# Patient Record
Sex: Female | Born: 1975 | Race: Black or African American | Hispanic: No | Marital: Single | Smoking: Never smoker
Health system: Southern US, Community
[De-identification: ages and names within clinical notes are randomized; demographics above are authoritative.]

---

## 2016-03-19 ENCOUNTER — Encounter: Payer: Self-pay | Admitting: *Deleted

## 2016-03-19 ENCOUNTER — Emergency Department: Payer: Self-pay

## 2016-03-19 DIAGNOSIS — S2002XA Contusion of left breast, initial encounter: Secondary | ICD-10-CM | POA: Insufficient documentation

## 2016-03-19 DIAGNOSIS — Y999 Unspecified external cause status: Secondary | ICD-10-CM | POA: Insufficient documentation

## 2016-03-19 DIAGNOSIS — Y939 Activity, unspecified: Secondary | ICD-10-CM | POA: Insufficient documentation

## 2016-03-19 DIAGNOSIS — Y92410 Unspecified street and highway as the place of occurrence of the external cause: Secondary | ICD-10-CM | POA: Insufficient documentation

## 2016-03-19 LAB — CBC
HCT: 29.2 % — ABNORMAL LOW (ref 35.0–47.0)
Hemoglobin: 9 g/dL — ABNORMAL LOW (ref 12.0–16.0)
MCH: 22.8 pg — ABNORMAL LOW (ref 26.0–34.0)
MCHC: 30.9 g/dL — ABNORMAL LOW (ref 32.0–36.0)
MCV: 73.9 fL — AB (ref 80.0–100.0)
PLATELETS: 523 10*3/uL — AB (ref 150–440)
RBC: 3.95 MIL/uL (ref 3.80–5.20)
RDW: 18.4 % — ABNORMAL HIGH (ref 11.5–14.5)
WBC: 13.1 10*3/uL — AB (ref 3.6–11.0)

## 2016-03-19 LAB — BASIC METABOLIC PANEL
Anion gap: 6 (ref 5–15)
BUN: 12 mg/dL (ref 6–20)
CHLORIDE: 107 mmol/L (ref 101–111)
CO2: 27 mmol/L (ref 22–32)
CREATININE: 0.86 mg/dL (ref 0.44–1.00)
Calcium: 8.8 mg/dL — ABNORMAL LOW (ref 8.9–10.3)
Glucose, Bld: 105 mg/dL — ABNORMAL HIGH (ref 65–99)
Potassium: 3.3 mmol/L — ABNORMAL LOW (ref 3.5–5.1)
SODIUM: 140 mmol/L (ref 135–145)

## 2016-03-19 LAB — TROPONIN I
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
## 2016-03-20 ENCOUNTER — Emergency Department
Admission: EM | Admit: 2016-03-20 | Discharge: 2016-03-20 | Disposition: A | Discharge status: Home / Self Care | Payer: Self-pay | Attending: Emergency Medicine | Admitting: Emergency Medicine

## 2016-03-20 ENCOUNTER — Encounter: Payer: Self-pay | Admitting: Emergency Medicine

## 2016-03-20 DIAGNOSIS — S2002XA Contusion of left breast, initial encounter: Secondary | ICD-10-CM

## 2016-03-20 DIAGNOSIS — N6489 Other specified disorders of breast: Secondary | ICD-10-CM

## 2016-03-20 MED ORDER — KETOROLAC TROMETHAMINE 60 MG/2ML IM SOLN
60.0000 mg | Freq: Once | INTRAMUSCULAR | Status: AC
  Administered 2016-03-20: 60 mg via INTRAMUSCULAR
  Filled 2016-03-20: qty 2

## 2016-03-20 MED ORDER — ETODOLAC 200 MG PO CAPS: 200.0000 mg | ORAL_CAPSULE | Freq: Three times a day (TID) | ORAL | 0 refills | Status: DC

## 2016-03-20 MED ORDER — TRAMADOL HCL 50 MG PO TABS
50.0000 mg | ORAL_TABLET | Freq: Once | ORAL | Status: AC
  Administered 2016-03-20: 50 mg via ORAL
  Filled 2016-03-20: qty 1
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
## 2016-03-20 NOTE — Discharge Instructions (Signed)
Please ice your left breast and where supportive bras. Please follow up to have her blood counts reviewed. They did decrease from the last ER visit. It is likely due to the hematoma but as she has no secondary symptoms of symptomatic anemia we will have reevaluated. Please return with any lightheadedness, dizziness or passing out episodes. Please return with worsened pain nausea or vomiting.
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
## 2016-03-20 NOTE — ED Notes (Signed)
ED Provider at bedside. 

## 2017-03-06 ENCOUNTER — Emergency Department
Admission: EM | Admit: 2017-03-06 | Discharge: 2017-03-06 | Disposition: A | Discharge status: Home / Self Care | Payer: Medicaid Other | Attending: Emergency Medicine | Admitting: Emergency Medicine

## 2017-03-06 ENCOUNTER — Other Ambulatory Visit: Payer: Self-pay

## 2017-03-06 ENCOUNTER — Encounter: Payer: Self-pay | Admitting: Emergency Medicine

## 2017-03-06 DIAGNOSIS — K625 Hemorrhage of anus and rectum: Secondary | ICD-10-CM | POA: Diagnosis not present

## 2017-03-06 LAB — CBC
HEMATOCRIT: 31.8 % — AB (ref 35.0–47.0)
HEMOGLOBIN: 9.9 g/dL — AB (ref 12.0–16.0)
MCH: 22.6 pg — AB (ref 26.0–34.0)
MCHC: 31.1 g/dL — AB (ref 32.0–36.0)
MCV: 72.5 fL — AB (ref 80.0–100.0)
Platelets: 529 10*3/uL — ABNORMAL HIGH (ref 150–440)
RBC: 4.38 MIL/uL (ref 3.80–5.20)
RDW: 19.4 % — ABNORMAL HIGH (ref 11.5–14.5)
WBC: 9.9 10*3/uL (ref 3.6–11.0)

## 2017-03-06 LAB — COMPREHENSIVE METABOLIC PANEL
ALBUMIN: 3.3 g/dL — AB (ref 3.5–5.0)
ALK PHOS: 78 U/L (ref 38–126)
ALT: 20 U/L (ref 14–54)
ANION GAP: 6 (ref 5–15)
AST: 24 U/L (ref 15–41)
BUN: 7 mg/dL (ref 6–20)
CO2: 27 mmol/L (ref 22–32)
Calcium: 8.5 mg/dL — ABNORMAL LOW (ref 8.9–10.3)
Chloride: 103 mmol/L (ref 101–111)
Creatinine, Ser: 0.79 mg/dL (ref 0.44–1.00)
GFR calc Af Amer: >60 mL/min (ref >60)
GFR calc non Af Amer: >60 mL/min (ref >60)
GLUCOSE: 171 mg/dL — AB (ref 65–99)
POTASSIUM: 3 mmol/L — AB (ref 3.5–5.1)
SODIUM: 136 mmol/L (ref 135–145)
Total Bilirubin: 0.6 mg/dL (ref 0.3–1.2)
Total Protein: 7.3 g/dL (ref 6.5–8.1)

## 2017-03-06 MED ORDER — DOCUSATE SODIUM 100 MG PO CAPS: 100.0000 mg | ORAL_CAPSULE | Freq: Two times a day (BID) | ORAL | 0 refills | Status: AC

## 2017-03-06 NOTE — ED Notes (Signed)
Pt ambulatory to toilet by self. Given urine specimen cup in case of need for urine.

## 2017-03-06 NOTE — ED Notes (Addendum)
Pt states rectal bleeding x 1 week. States didn't think anything of it until she noticed blood in her bed. Finished period on 12/18 so doesn't think bleeding is vaginal. C/o abdominal pain L and R upper quadrants intermittently. Still has all belly organs. Had hemorroids 9 years ago. BM today. States only notices blood when she wipes.

## 2017-03-06 NOTE — Discharge Instructions (Signed)
To the emergency department for new, worsening, or persistent bleeding from the rectum or vaginal area, lightheadedness or weakness, worsening abdominal pain, or any other new or worsening symptoms that concern you.  You can make an appointment to follow-up with the general surgeon for further evaluation for what are likely hemorrhoids causing the bleeding.  You should also make an appointment with your OB/GYN if you have further vaginal bleeding.

## 2017-03-06 NOTE — ED Provider Notes (Signed)
[facility] Emergency Department Provider Note ____________________________________________   First MD Initiated Contact with Patient 03/06/17 1225     (approximate)  I have reviewed the triage vital signs and the nursing notes.   HISTORY  Chief Complaint Rectal Bleeding    HPI Kaitlin Beasley is a 41 y.o. female with past medical history as noted below who presents with rectal and/or vaginal bleeding for the last week, initially only noticing blood when she wipes after a bowel movement, but then today she noticed a trickle of blood while she was laying in the bed.  Patient reports bright red blood.  There is no associated rectal or vaginal.  Patient does say that since she got to the emergency department she has had some mid/upper abdominal crampy discomfort.  No urinary symptoms, no vaginal discharge, and no prior history of this pain or bleeding.  Patient states that she does strain with bowel movements, but has not noticed any specific change in her bowel movements recently.  She denies nausea or vomiting.  She denies fever.  History reviewed. No pertinent past medical history.  There are no active problems to display for this patient.   History reviewed. No pertinent surgical history.  Prior to Admission medications   Medication Sig Start Date End Date Taking? Authorizing Provider  docusate sodium (COLACE) 100 MG capsule Take 1 capsule (100 mg total) by mouth 2 (two) times daily for 15 days. 03/06/17 03/21/17  Dionne BucySiadecki, Shelsy Seng, MD  etodolac (LODINE) 200 MG capsule Take 1 capsule (200 mg total) by mouth every 8 (eight) hours. 03/20/16   Rebecka ApleyWebster, Allison P, MD    Allergies Patient has no known allergies.  No family history on file.  Social History Social History   Tobacco Use  . Smoking status: Never Smoker  . Smokeless tobacco: Never Used  Substance Use Topics  . Alcohol use: No  . Drug use: Not on file    Review of  Systems  Constitutional: No fever.  Eyes: No redness. ENT: No sore throat. Cardiovascular: Denies chest pain. Respiratory: Denies shortness of breath. Gastrointestinal: No nausea, no vomiting.  No diarrhea.  Genitourinary: Negative for dysuria.  Musculoskeletal: Negative for back pain. Skin: Negative for rash. Neurological: Negative for headache.   ____________________________________________   PHYSICAL EXAM:  VITAL SIGNS: ED Triage Vitals  Enc Vitals Group     BP 03/06/17 1056 (!) 157/88     Pulse Rate 03/06/17 1056 80     Resp 03/06/17 1228 (!) 23     Temp 03/06/17 1056 98.6 F (37 C)     Temp Source 03/06/17 1056 Oral     SpO2 03/06/17 1056 100 %     Weight 03/06/17 1057 (!) 340 lb (154.2 kg)     Height 03/06/17 1057 5\' 8"  (1.727 m)     Head Circumference --      Peak Flow --      Pain Score 03/06/17 1112 0     Pain Loc --      Pain Edu? --      Excl. in GC? --     Constitutional: Alert and oriented. Well appearing and in no acute distress. Eyes: Conjunctivae are normal.  No pallor. Head: Atraumatic. Nose: No congestion/rhinnorhea. Mouth/Throat: Mucous membranes are moist.   Neck: Normal range of motion.  Cardiovascular:   Good peripheral circulation. Respiratory: Normal respiratory effort.  No retractions.  Gastrointestinal: Soft and nontender. No distention.  Small external hemorrhoid visualized, but no active bleeding  or blood found on DRE.  Stool is brown.  Genitourinary: No flank tenderness.  Normal external genitalia.  No CMT or adnexal tenderness.  No blood in the vaginal vault. Musculoskeletal: Extremities warm and well perfused.  Neurologic:  Normal speech and language. No gross focal neurologic deficits are appreciated.  Skin:  Skin is warm and dry. No rash noted. Psychiatric: Mood and affect are normal. Speech and behavior are normal.  ____________________________________________   LABS (all labs ordered are listed, but only abnormal results are  displayed)  Labs Reviewed  COMPREHENSIVE METABOLIC PANEL - Abnormal; Notable for the following components:      Result Value   Potassium 3.0 (*)    Glucose, Bld 171 (*)    Calcium 8.5 (*)    Albumin 3.3 (*)    All other components within normal limits  CBC - Abnormal; Notable for the following components:   Hemoglobin 9.9 (*)    HCT 31.8 (*)    MCV 72.5 (*)    MCH 22.6 (*)    MCHC 31.1 (*)    RDW 19.4 (*)    Platelets 529 (*)    All other components within normal limits   ____________________________________________  EKG   ____________________________________________  RADIOLOGY    ____________________________________________   PROCEDURES  Procedure(s) performed: No    Critical Care performed: No ____________________________________________   INITIAL IMPRESSION / ASSESSMENT AND PLAN / ED COURSE  Pertinent labs & imaging results that were available during my care of the patient were reviewed by me and considered in my medical decision making (see chart for details).  41 year old female with past medical history of hypertension but currently not on any medications presents with rectal and/or vaginal bleeding for the last week, initially occurring just with bowel movements but now with an episode in between bowel movements.  She also just started having some abdominal crampy pain since she arrived to the emergency department.  Past medical records reviewed in Epic and are noncontributory.  On exam, patient is well-appearing, vital signs are normal, and the abdomen is nontender.  Vaginal exam is unremarkable, and the rectal exam reveals small external hemorrhoid which is nonthrombosed, but no blood in the vault or active bleeding.  Patient states she had a bowel movement in the emergency department and the pain has completely resolved.  There was no blood during her bowel movement here.  I suspect most likely mild hemorrhoidal bleeding.  Lower suspicion for vaginal  source, in which case most likely etiology would be fibroids.  Patient's hemoglobin is actually improved from our last record 11 months ago, and she has no symptoms of anemia or acute blood loss.  Borderline hyponatremia but patient is not symptomatic from this.    Patient feels much better and would like to go home.  Return precautions given.  I will start the patient on stool softener, and I have provided her a referral to general surgeon for further evaluation and of her hemorrhoids if she has any recurrent bleeding.  Patient expresses understanding of the discharge instructions and return precautions.      ____________________________________________   FINAL CLINICAL IMPRESSION(S) / ED DIAGNOSES  Final diagnoses:  Rectal bleeding      NEW MEDICATIONS STARTED DURING THIS VISIT:  This SmartLink is deprecated. Use AVSMEDLIST instead to display the medication list for a patient.   Note:  This document was prepared using Dragon voice recognition software and may include unintentional dictation errors.    Dionne BucySiadecki, Marquerite Forsman, MD 03/06/17 [id number]

## 2017-03-06 NOTE — ED Triage Notes (Signed)
Pt reports that she has had a light red blood for a week, when she has gone to the bathroom and wiped. She states that this am she didn't go to the bathroom and felt something wet and she wiped her self and had light red on the tissue paper.

## 2017-03-06 NOTE — ED Notes (Signed)
Pelvic and rectal exams performed by MD. Witnessed by this RN. No swabs sent. No bleeding noted. MD noted external hemorroid. Pt stated that she has no pain after using the restroom.

## 2017-09-17 DIAGNOSIS — R51 Headache: Secondary | ICD-10-CM | POA: Insufficient documentation

## 2017-09-17 DIAGNOSIS — Z79899 Other long term (current) drug therapy: Secondary | ICD-10-CM | POA: Insufficient documentation

## 2017-09-17 NOTE — ED Triage Notes (Signed)
Patient ambulatory to triage with steady gait, without difficulty or distress noted; pt reports right sided HA x 2 wk accomp by nausea; took ibuprofen at 12 with some relief; denies hx of same

## 2017-09-18 ENCOUNTER — Encounter: Payer: Self-pay | Admitting: Emergency Medicine

## 2017-09-18 ENCOUNTER — Emergency Department: Payer: Self-pay

## 2017-09-18 ENCOUNTER — Other Ambulatory Visit: Payer: Self-pay

## 2017-09-18 ENCOUNTER — Emergency Department
Admission: EM | Admit: 2017-09-18 | Discharge: 2017-09-18 | Disposition: A | Discharge status: Home / Self Care | Payer: Self-pay | Attending: Emergency Medicine | Admitting: Emergency Medicine

## 2017-09-18 DIAGNOSIS — R51 Headache: Secondary | ICD-10-CM

## 2017-09-18 DIAGNOSIS — R519 Headache, unspecified: Secondary | ICD-10-CM

## 2017-09-18 LAB — URINALYSIS, COMPLETE (UACMP) WITH MICROSCOPIC
Bacteria, UA: NONE SEEN
Bilirubin Urine: NEGATIVE
GLUCOSE, UA: NEGATIVE mg/dL
Hgb urine dipstick: NEGATIVE
Ketones, ur: NEGATIVE mg/dL
Nitrite: NEGATIVE
PH: 6 (ref 5.0–8.0)
Protein, ur: NEGATIVE mg/dL
SPECIFIC GRAVITY, URINE: 1.019 (ref 1.005–1.030)

## 2017-09-18 LAB — POCT PREGNANCY, URINE: Preg Test, Ur: NEGATIVE

## 2017-09-18 MED ORDER — BUTALBITAL-APAP-CAFFEINE 50-325-40 MG PO TABS: 1.0000 | ORAL_TABLET | Freq: Four times a day (QID) | ORAL | 0 refills | Status: DC | PRN

## 2017-09-18 MED ORDER — BUTALBITAL-APAP-CAFFEINE 50-325-40 MG PO TABS
1.0000 | ORAL_TABLET | Freq: Once | ORAL | Status: AC
  Administered 2017-09-18: 1 via ORAL
  Filled 2017-09-18: qty 1

## 2017-09-18 NOTE — ED Provider Notes (Signed)
[facility] Emergency Department Provider Note ____   First MD Initiated Contact with Patient 09/18/17 0118     (approximate)  I have reviewed the triage vital signs and the nursing notes.   HISTORY  Chief Complaint No chief complaint on file.    HPI Kaitlin Beasley is a 42 y.o. female Modena Jansky to the emergency department with a 2-week history of generalized headache accompanied by nausea.  Patient states that she is taking ibuprofen without any relief.  Patient states current pain score is 5 out of 10.  Patient denies any weakness numbness gait instability or visual changes.  Patient does admit to daytime somnolence.  Patient states that she snores very loudly and also notes that she has periods of times where she stops breathing while sleeping.   Past medical history None There are no active problems to display for this patient.   Past surgical history None  Prior to Admission medications   Medication Sig Start Date End Date Taking? Authorizing Provider  etodolac (LODINE) 200 MG capsule Take 1 capsule (200 mg total) by mouth every 8 (eight) hours. 03/20/16   Rebecka Apley, MD    Allergies No known drug allergies she is No family history on file.  Social History Social History   Tobacco Use  . Smoking status: Never Smoker  . Smokeless tobacco: Never Used  Substance Use Topics  . Alcohol use: No  . Drug use: Not on file    Review of Systems Constitutional: No fever/chills Eyes: No visual changes. ENT: No sore throat. Cardiovascular: Denies chest pain. Respiratory: Denies shortness of breath. Gastrointestinal: No abdominal pain.  No nausea, no vomiting.  No diarrhea.  No constipation. Genitourinary: Negative for dysuria. Musculoskeletal: Negative for neck pain.  Negative for back pain. Integumentary: Negative for rash. Neurological: Negative for headaches, focal weakness or  numbness.  ____________________________________________   PHYSICAL EXAM:  VITAL SIGNS: ED Triage Vitals  Enc Vitals Group     BP 09/17/17 2354 (!) 160/92     Pulse Rate 09/17/17 2354 89     Resp 09/17/17 2354 18     Temp 09/17/17 2354 98.3 F (36.8 C)     Temp Source 09/17/17 2354 Oral     SpO2 09/17/17 2354 98 %     Weight 09/17/17 2354 (!) 145.2 kg (320 lb)     Height 09/17/17 2354 1.702 m (5\' 7" )     Head Circumference --      Peak Flow --      Pain Score 09/18/17 0047 5     Pain Loc --      Pain Edu? --      Excl. in GC? --     Constitutional: Alert and oriented. Well appearing and in no acute distress. Eyes: Conjunctivae are normal. PERRL. EOMI. Head: Atraumatic. Mouth/Throat: Mucous membranes are moist. Oropharynx non-erythematous. Neck: No stridor.   Cardiovascular: Normal rate, regular rhythm. Good peripheral circulation. Grossly normal heart sounds. Respiratory: Normal respiratory effort.  No retractions. Lungs CTAB. Gastrointestinal: Soft and nontender. No distention.  Musculoskeletal: No lower extremity tenderness nor edema. No gross deformities of extremities. Neurologic:  Normal speech and language. No gross focal neurologic deficits are appreciated.  Skin:  Skin is warm, dry and intact. No rash noted. Psychiatric: Mood and affect are normal. Speech and behavior are normal.  ____________________________________________   LABS (all labs ordered are listed, but only abnormal results are displayed)  Labs Reviewed  URINALYSIS, COMPLETE (UACMP) WITH MICROSCOPIC -  Abnormal; Notable for the following components:      Result Value   Color, Urine YELLOW (*)    APPearance CLEAR (*)    Leukocytes, UA TRACE (*)    All other components within normal limits  POCT PREGNANCY, URINE    RADIOLOGY I, [place] Ellese Julius, personally viewed and evaluated these images (plain radiographs) as part of my medical decision making, as well as reviewing the written report by the  radiologist.  ED MD interpretation: No acute intracranial findings  Official radiology report(s): Ct Head Wo Contrast  Result Date: 09/18/2017 CLINICAL DATA:  Acute headache with normal neuro exam. History of chronic headaches. EXAM: CT HEAD WITHOUT CONTRAST TECHNIQUE: Contiguous axial images were obtained from the base of the skull through the vertex without intravenous contrast. COMPARISON:  None. FINDINGS: Brain: No evidence of acute infarction, hemorrhage, hydrocephalus, extra-axial collection or mass lesion/mass effect. Partially empty sella Vascular: No hyperdense vessel or unexpected calcification. 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 No acute finding or specific explanation for headache. 2. Partially empty sella, commonly seen and incidental but sometimes attributed to chronic elevated intracranial pressure. Pseudotumor is a consideration given the history and patient factors. 3. Small remote appearing subcortical insult in the right frontal region, nonspecific. Electronically Signed   By: Marnee SpringJonathon  Watts M.D.   On: 09/18/2017 00:38    __________________________  Procedures   ____________________________________________   INITIAL IMPRESSION / ASSESSMENT AND PLAN / ED COURSE  As part of my medical decision making, I reviewed the following data within the electronic MEDICAL RECORD NUMBER   42 year old female presented with above-stated history and physical exam secondary to headache.  Patient given Fioricet with improvement of pain.  History and physical exam concerning for possible sleep apnea and as such patient will be referred to Dr. Thedore MinsSingh for further outpatient evaluation and management.  Also concern for possibility of hypertension and as such patient will be referred for this as well. ____________________________________________  FINAL CLINICAL IMPRESSION(S) / ED DIAGNOSES  Final diagnoses:  Acute nonintractable  headache, unspecified headache type     MEDICATIONS GIVEN DURING THIS VISIT:  Medications  butalbital-acetaminophen-caffeine (FIORICET, ESGIC) 50-325-40 MG per tablet 1 tablet (has no administration in time range)     ED Discharge Orders    None       Note:  This document was prepared using Dragon voice recognition software and may include unintentional dictation errors.    Darci CurrentBrown, [place] N, MD 09/18/17 [phone]

## 2017-09-24 ENCOUNTER — Encounter: Payer: Self-pay | Admitting: Emergency Medicine

## 2017-09-24 ENCOUNTER — Emergency Department
Admission: EM | Admit: 2017-09-24 | Discharge: 2017-09-24 | Disposition: A | Discharge status: Home / Self Care | Payer: Self-pay | Attending: Emergency Medicine | Admitting: Emergency Medicine

## 2017-09-24 ENCOUNTER — Other Ambulatory Visit: Payer: Self-pay

## 2017-09-24 DIAGNOSIS — I10 Essential (primary) hypertension: Secondary | ICD-10-CM | POA: Insufficient documentation

## 2017-09-24 DIAGNOSIS — Z79899 Other long term (current) drug therapy: Secondary | ICD-10-CM | POA: Insufficient documentation

## 2017-09-24 LAB — BASIC METABOLIC PANEL
ANION GAP: 6 (ref 5–15)
BUN: 11 mg/dL (ref 6–20)
CALCIUM: 8.9 mg/dL (ref 8.9–10.3)
CO2: 27 mmol/L (ref 22–32)
Chloride: 105 mmol/L (ref 98–111)
Creatinine, Ser: 1 mg/dL (ref 0.44–1.00)
Glucose, Bld: 133 mg/dL — ABNORMAL HIGH (ref 70–99)
Potassium: 3.4 mmol/L — ABNORMAL LOW (ref 3.5–5.1)
SODIUM: 138 mmol/L (ref 135–145)

## 2017-09-24 LAB — TROPONIN I

## 2017-09-24 LAB — CBC
HCT: 29.4 % — ABNORMAL LOW (ref 35.0–47.0)
HEMOGLOBIN: 9.1 g/dL — AB (ref 12.0–16.0)
MCH: 21.1 pg — ABNORMAL LOW (ref 26.0–34.0)
MCHC: 30.9 g/dL — ABNORMAL LOW (ref 32.0–36.0)
MCV: 68.4 fL — ABNORMAL LOW (ref 80.0–100.0)
Platelets: 536 10*3/uL — ABNORMAL HIGH (ref 150–440)
RBC: 4.3 MIL/uL (ref 3.80–5.20)
RDW: 19.8 % — ABNORMAL HIGH (ref 11.5–14.5)
WBC: 13.2 10*3/uL — AB (ref 3.6–11.0)

## 2017-09-24 LAB — POCT PREGNANCY, URINE: PREG TEST UR: NEGATIVE

## 2017-09-24 MED ORDER — AMLODIPINE BESYLATE 5 MG PO TABS
5.0000 mg | ORAL_TABLET | Freq: Once | ORAL | Status: AC
  Administered 2017-09-24: 5 mg via ORAL
  Filled 2017-09-24: qty 1

## 2017-09-24 MED ORDER — AMLODIPINE BESYLATE 5 MG PO TABS: 5.0000 mg | ORAL_TABLET | Freq: Every day | ORAL | 0 refills | Status: AC

## 2017-09-24 MED ORDER — OXYCODONE-ACETAMINOPHEN 5-325 MG PO TABS
1.0000 | ORAL_TABLET | Freq: Once | ORAL | Status: AC
  Administered 2017-09-24: 1 via ORAL
  Filled 2017-09-24: qty 1

## 2017-09-24 NOTE — ED Triage Notes (Signed)
Patient to ER for c/o HTN. States she was seen here once before for same complaint. Patient states prior to that, she did not have history of any HTN. Also reports headache. Patient reports BP of 154/111 at home. Denies any chest pain.

## 2017-09-24 NOTE — ED Provider Notes (Signed)
[facility] Emergency Department Provider Note   Time seen 5:15 AM  I have reviewed the triage vital signs and the nursing notes.   HISTORY  Chief Complaint Hypertension    HPI Kaitlin Beasley is a 42 y.o. female presents to the emergency department with hypertension with blood pressure 154/111 at home.  Also patient admits to right temporal headache.  Patient denies any weakness numbness gait instability or visual changes.   Past medical history None There are no active problems to display for this patient.   History reviewed. No pertinent surgical history.  Prior to Admission medications   Medication Sig Start Date End Date Taking? Authorizing Provider  butalbital-acetaminophen-caffeine (FIORICET, ESGIC) [phone] [id number] MG tablet Take 1 tablet by mouth every 6 (six) hours as needed for headache. 09/18/17   Darci CurrentBrown, Aldan N, MD  etodolac (LODINE) 200 MG capsule Take 1 capsule (200 mg total) by mouth every 8 (eight) hours. 03/20/16   Rebecka ApleyWebster, Allison P, MD    Allergies No known drug allergies No family history on file.  Social History Social History   Tobacco Use  . Smoking status: Never Smoker  . Smokeless tobacco: Never Used  Substance Use Topics  . Alcohol use: No  . Drug use: Not on file    Review of Systems Constitutional: No fever/chills Eyes: No visual changes. ENT: No sore throat. Cardiovascular: Denies chest pain. Respiratory: Denies shortness of breath. Gastrointestinal: No abdominal pain.  No nausea, no vomiting.  No diarrhea.  No constipation. Genitourinary: Negative for dysuria. Musculoskeletal: Negative for neck pain.  Negative for back pain. Integumentary: Negative for rash. Neurological: Positive for headaches, negative for focal weakness or numbness.   ____________________________________________   PHYSICAL EXAM:  VITAL SIGNS: ED Triage Vitals  Enc Vitals Group     BP 09/24/17 0139 131/77     Pulse Rate 09/24/17  0139 98     Resp 09/24/17 0139 20     Temp 09/24/17 0139 98.6 F (37 C)     Temp Source 09/24/17 0139 Oral     SpO2 09/24/17 0139 96 %     Weight 09/24/17 0140 (!) 145.2 kg (320 lb)     Height 09/24/17 0140 1.702 m (5\' 7" )     Head Circumference --      Peak Flow --      Pain Score 09/24/17 0139 0     Pain Loc --      Pain Edu? --      Excl. in GC? --     Constitutional: Alert and oriented. Well appearing and in no acute distress. Eyes: Conjunctivae are normal. PERRL. EOMI. Head: Atraumatic. Ears:  Healthy appearing ear canals and TMs bilaterally Nose: No congestion/rhinnorhea. Mouth/Throat: Mucous membranes are moist.  Oropharynx non-erythematous. Neck: No stridor.  Cardiovascular: Normal rate, regular rhythm. Good peripheral circulation. Grossly normal heart sounds. Respiratory: Normal respiratory effort.  No retractions. Lungs CTAB. Gastrointestinal: Soft and nontender. No distention.  Musculoskeletal: No lower extremity tenderness nor edema. No gross deformities of extremities. Neurologic:  Normal speech and language. No gross focal neurologic deficits are appreciated.  Skin:  Skin is warm, dry and intact. No rash noted. Psychiatric: Mood and affect are normal. Speech and behavior are normal.  ____________________________________________   LABS (all labs ordered are listed, but only abnormal results are displayed)  Labs Reviewed  BASIC METABOLIC PANEL - Abnormal; Notable for the following components:      Result Value   Potassium 3.4 (*)  Glucose, Bld 133 (*)    All other components within normal limits  CBC - Abnormal; Notable for the following components:   WBC 13.2 (*)    Hemoglobin 9.1 (*)    HCT 29.4 (*)    MCV 68.4 (*)    MCH 21.1 (*)    MCHC 30.9 (*)    RDW 19.8 (*)    Platelets 536 (*)    All other components within normal limits  TROPONIN I  POC URINE PREG, ED  POCT PREGNANCY, URINE   ____________________________________________  EKG  ED ECG  REPORT I, [place] N BROWN, the attending physician, personally viewed and interpreted this ECG.   Date: 09/24/2017  EKG Time: 1:42 AM  Rate: 91  Rhythm: Normal sinus rhythm  Axis: Normal  Intervals: Normal  ST&T Change: None  Procedures   ____________________________________________   INITIAL IMPRESSION / ASSESSMENT AND PLAN / ED COURSE  As part of my medical decision making, I reviewed the following data within the electronic MEDICAL RECORD NUMBER   42 year old female with above-stated history and physical exam secondary to headache and hypertension.  Patient CT scan during last ED evaluation concerning for possible pseudotumor.  Patient was prescribed Fioricet however she was not able to fill the prescription.  Patient has had multiple episodes of hypertension and as such we will prescribe Norvasc today patient also was unable to follow-up as recommended and as such patient will be referred again today to primary care provider for further outpatient evaluation and management.  ____________________________________________  FINAL CLINICAL IMPRESSION(S) / ED DIAGNOSES  Final diagnoses:  Hypertension, unspecified type     MEDICATIONS GIVEN DURING THIS VISIT:  Medications  amLODipine (NORVASC) tablet 5 mg (has no administration in time range)  oxyCODONE-acetaminophen (PERCOCET/ROXICET) 5-325 MG per tablet 1 tablet (has no administration in time range)     ED Discharge Orders    None       Note:  This document was prepared using Dragon voice recognition software and may include unintentional dictation errors.    Darci Current, MD 09/24/17 [phone]

## 2017-09-24 NOTE — ED Notes (Signed)
Dr. Brown at the bedside for pt evaluation 

## 2017-10-13 ENCOUNTER — Emergency Department
Admission: EM | Admit: 2017-10-13 | Discharge: 2017-10-13 | Disposition: A | Discharge status: Home / Self Care | Payer: Self-pay | Attending: Emergency Medicine | Admitting: Emergency Medicine

## 2017-10-13 ENCOUNTER — Emergency Department: Payer: Self-pay

## 2017-10-13 ENCOUNTER — Encounter: Payer: Self-pay | Admitting: Emergency Medicine

## 2017-10-13 DIAGNOSIS — Z79899 Other long term (current) drug therapy: Secondary | ICD-10-CM | POA: Insufficient documentation

## 2017-10-13 DIAGNOSIS — R109 Unspecified abdominal pain: Secondary | ICD-10-CM

## 2017-10-13 DIAGNOSIS — K859 Acute pancreatitis without necrosis or infection, unspecified: Secondary | ICD-10-CM | POA: Insufficient documentation

## 2017-10-13 DIAGNOSIS — Z3202 Encounter for pregnancy test, result negative: Secondary | ICD-10-CM | POA: Insufficient documentation

## 2017-10-13 LAB — COMPREHENSIVE METABOLIC PANEL
ALT: 14 U/L (ref 0–44)
AST: 18 U/L (ref 15–41)
Albumin: 3.5 g/dL (ref 3.5–5.0)
Alkaline Phosphatase: 67 U/L (ref 38–126)
Anion gap: 6 (ref 5–15)
BILIRUBIN TOTAL: 0.4 mg/dL (ref 0.3–1.2)
BUN: 11 mg/dL (ref 6–20)
CO2: 27 mmol/L (ref 22–32)
CREATININE: 0.8 mg/dL (ref 0.44–1.00)
Calcium: 8.7 mg/dL — ABNORMAL LOW (ref 8.9–10.3)
Chloride: 106 mmol/L (ref 98–111)
GFR calc Af Amer: >60 mL/min (ref >60)
GFR calc non Af Amer: >60 mL/min (ref >60)
Glucose, Bld: 152 mg/dL — ABNORMAL HIGH (ref 70–99)
Potassium: 3.5 mmol/L (ref 3.5–5.1)
Sodium: 139 mmol/L (ref 135–145)
Total Protein: 7.5 g/dL (ref 6.5–8.1)

## 2017-10-13 LAB — URINALYSIS, COMPLETE (UACMP) WITH MICROSCOPIC
BILIRUBIN URINE: NEGATIVE
Bacteria, UA: NONE SEEN
Glucose, UA: NEGATIVE mg/dL
Hgb urine dipstick: NEGATIVE
Ketones, ur: NEGATIVE mg/dL
LEUKOCYTES UA: NEGATIVE
Nitrite: NEGATIVE
PH: 6 (ref 5.0–8.0)
Protein, ur: NEGATIVE mg/dL
SPECIFIC GRAVITY, URINE: 1.015 (ref 1.005–1.030)

## 2017-10-13 LAB — CBC
HCT: 28.2 % — ABNORMAL LOW (ref 35.0–47.0)
Hemoglobin: 9.4 g/dL — ABNORMAL LOW (ref 12.0–16.0)
MCH: 22.6 pg — ABNORMAL LOW (ref 26.0–34.0)
MCHC: 33.5 g/dL (ref 32.0–36.0)
MCV: 67.6 fL — AB (ref 80.0–100.0)
Platelets: 581 10*3/uL — ABNORMAL HIGH (ref 150–440)
RBC: 4.17 MIL/uL (ref 3.80–5.20)
RDW: 20 % — ABNORMAL HIGH (ref 11.5–14.5)
WBC: 13.8 10*3/uL — AB (ref 3.6–11.0)

## 2017-10-13 LAB — LIPASE, BLOOD: Lipase: 80 U/L — ABNORMAL HIGH (ref 11–51)

## 2017-10-13 LAB — POCT PREGNANCY, URINE: Preg Test, Ur: NEGATIVE

## 2017-10-13 MED ORDER — ONDANSETRON 4 MG PO TBDP
4.0000 mg | ORAL_TABLET | Freq: Once | ORAL | Status: AC
  Administered 2017-10-13: 4 mg via ORAL
  Filled 2017-10-13: qty 1

## 2017-10-13 MED ORDER — LACTULOSE 10 GM/15ML PO SOLN: 20.0000 g | Freq: Every day | ORAL | 0 refills | Status: DC | PRN

## 2017-10-13 MED ORDER — HYDROCODONE-ACETAMINOPHEN 5-325 MG PO TABS: 1.0000 | ORAL_TABLET | Freq: Four times a day (QID) | ORAL | 0 refills | Status: DC | PRN

## 2017-10-13 MED ORDER — HYDROCODONE-ACETAMINOPHEN 5-325 MG PO TABS
1.0000 | ORAL_TABLET | Freq: Once | ORAL | Status: AC
  Administered 2017-10-13: 1 via ORAL
  Filled 2017-10-13: qty 1

## 2017-10-13 MED ORDER — ONDANSETRON 4 MG PO TBDP: 4.0000 mg | ORAL_TABLET | Freq: Three times a day (TID) | ORAL | 0 refills | Status: DC | PRN

## 2017-10-13 NOTE — ED Notes (Signed)
After collecting urine proceeded to draw blood from patient. Patient stated she needed to go back to the bathroom to have a bowel movement. Patient stated took a saline laxative earlier.AS

## 2017-10-13 NOTE — ED Triage Notes (Signed)
Patient with right side abdominal pain that started yesterday. Patient states some nausea but no vomiting. Patient denies diarrhea. States that her last BM was yesterday.

## 2017-10-13 NOTE — ED Provider Notes (Signed)
[facility] Emergency Department Provider Note   ____________________________________________   First MD Initiated Contact with Patient 10/13/17 0402     (approximate)  I have reviewed the triage vital signs and the nursing notes.   HISTORY  Chief Complaint Abdominal Pain    HPI Kaitlin Beasley is a 42 y.o. female who presents to the ED from home with a chief complaint of abdominal pain.  Patient reports a 1 day history of right-sided abdominal pain associated with nausea without vomiting.  Thought she was constipated so took a saline enema prior to arrival.  She did have a bowel movement in the ED lobby.  Denies associated fever, chills, chest pain, shortness of breath, dysuria, vaginal bleeding/discharge, diarrhea.  Denies recent travel or trauma.  Notes she has been eating a lot of salad with dressing as well as grapefruit juice.   Past medical history None  There are no active problems to display for this patient.   History reviewed. No pertinent surgical history.  Prior to Admission medications   Medication Sig Start Date End Date Taking? Authorizing Provider  amLODipine (NORVASC) 5 MG tablet Take 1 tablet (5 mg total) by mouth daily. 09/24/17 10/24/17  Darci Current, MD  butalbital-acetaminophen-caffeine (FIORICET, ESGIC) [id number] MG tablet Take 1 tablet by mouth every 6 (six) hours as needed for headache. 09/18/17   Darci Current, MD  etodolac (LODINE) 200 MG capsule Take 1 capsule (200 mg total) by mouth every 8 (eight) hours. 03/20/16   Rebecka Apley, MD    Allergies Patient has no known allergies.  No family history on file.  Social History Social History   Tobacco Use  . Smoking status: Never Smoker  . Smokeless tobacco: Never Used  Substance Use Topics  . Alcohol use: No  . Drug use: Never    Review of Systems  Constitutional: No fever/chills Eyes: No visual changes. ENT: No sore throat. Cardiovascular: Denies  chest pain. Respiratory: Denies shortness of breath. Gastrointestinal: Positive for abdominal pain and nausea, no vomiting.  No diarrhea.  No constipation. Genitourinary: Negative for dysuria. Musculoskeletal: Negative for back pain. Skin: Negative for rash. Neurological: Negative for headaches, focal weakness or numbness.   ____________________________________________   PHYSICAL EXAM:  VITAL SIGNS: ED Triage Vitals  Enc Vitals Group     BP 10/13/17 0113 (!) 159/93     Pulse Rate 10/13/17 0113 94     Resp 10/13/17 0113 16     Temp 10/13/17 0113 98.9 F (37.2 C)     Temp Source 10/13/17 0113 Oral     SpO2 10/13/17 0113 95 %     Weight 10/13/17 0047 (!) 320 lb (145.2 kg)     Height 10/13/17 0047 5\' 8"  (1.727 m)     Head Circumference --      Peak Flow --      Pain Score 10/13/17 0046 8     Pain Loc --      Pain Edu? --      Excl. in GC? --     Constitutional: Alert and oriented. Well appearing and in no acute distress.  Texting on her cell phone. Eyes: Conjunctivae are normal. PERRL. EOMI. Head: Atraumatic. Nose: No congestion/rhinnorhea. Mouth/Throat: Mucous membranes are moist.  Oropharynx non-erythematous. Neck: No stridor.   Cardiovascular: Normal rate, regular rhythm. Grossly normal heart sounds.  Good peripheral circulation. Respiratory: Normal respiratory effort.  No retractions. Lungs CTAB. Gastrointestinal: Soft and mildly tender to palpation epigastrium and umbilicus without  rebound or guarding.. No distention. No abdominal bruits. No CVA tenderness. Musculoskeletal: No lower extremity tenderness nor edema.  No joint effusions. Neurologic:  Normal speech and language. No gross focal neurologic deficits are appreciated. No gait instability. Skin:  Skin is warm, dry and intact. No rash noted. Psychiatric: Mood and affect are normal. Speech and behavior are normal.  ____________________________________________   LABS (all labs ordered are listed, but only  abnormal results are displayed)  Labs Reviewed  LIPASE, BLOOD - Abnormal; Notable for the following components:      Result Value   Lipase 80 (*)    All other components within normal limits  COMPREHENSIVE METABOLIC PANEL - Abnormal; Notable for the following components:   Glucose, Bld 152 (*)    Calcium 8.7 (*)    All other components within normal limits  CBC - Abnormal; Notable for the following components:   WBC 13.8 (*)    Hemoglobin 9.4 (*)    HCT 28.2 (*)    MCV 67.6 (*)    MCH 22.6 (*)    RDW 20.0 (*)    Platelets 581 (*)    All other components within normal limits  URINALYSIS, COMPLETE (UACMP) WITH MICROSCOPIC - Abnormal; Notable for the following components:   Color, Urine YELLOW (*)    APPearance CLEAR (*)    All other components within normal limits  POC URINE PREG, ED  POCT PREGNANCY, URINE   ____________________________________________  EKG  None ____________________________________________  RADIOLOGY  ED MD interpretation: CT abdomen and pelvis demonstrates no acute abnormality within the abdomen or pelvis; fatty liver, unremarkable gallbladder  Official radiology report(s): Ct Abdomen Pelvis Wo Contrast  Result Date: 10/13/2017 CLINICAL DATA:  Acute onset of right-sided abdominal pain and nausea. EXAM: CT ABDOMEN AND PELVIS WITHOUT CONTRAST TECHNIQUE: Multidetector CT imaging of the abdomen and pelvis was performed following the standard protocol without IV contrast. COMPARISON:  None. FINDINGS: Lower chest: The visualized lung bases are grossly clear. The visualized portions of the mediastinum are unremarkable. Hepatobiliary: Areas of fatty infiltration are noted within the liver. The gallbladder is unremarkable in appearance. The common bile duct remains normal in caliber. Pancreas: The pancreas is within normal limits. Spleen: The spleen is unremarkable in appearance. Adrenals/Urinary Tract: The adrenal glands are unremarkable in appearance. The kidneys are  within normal limits. There is no evidence of hydronephrosis. No renal or ureteral stones are identified. No perinephric stranding is seen. Stomach/Bowel: The stomach is unremarkable in appearance. The small bowel is within normal limits. The appendix is normal in caliber, without evidence of appendicitis. The colon is unremarkable in appearance. Vascular/Lymphatic: The abdominal aorta is unremarkable in appearance. The inferior vena cava is grossly unremarkable. No retroperitoneal lymphadenopathy is seen. No pelvic sidewall lymphadenopathy is identified. Reproductive: The bladder is mildly distended and grossly unremarkable. The uterus is unremarkable in appearance. The ovaries are relatively symmetric. No suspicious adnexal masses are seen. Other: No additional soft tissue abnormalities are seen. Musculoskeletal: No acute osseous abnormalities are identified. The visualized musculature is unremarkable in appearance. IMPRESSION: 1. No acute abnormality seen within the abdomen or pelvis. 2. Areas of fatty infiltration within the liver. Electronically Signed   By: Roanna RaiderJeffery  Chang M.D.   On: 10/13/2017 03:32    ____________________________________________   PROCEDURES  Procedure(s) performed: None  Procedures  Critical Care performed: No  ____________________________________________   INITIAL IMPRESSION / ASSESSMENT AND PLAN / ED COURSE  As part of my medical decision making, I reviewed the following data within  the electronic MEDICAL RECORD NUMBER Nursing notes reviewed and incorporated, Labs reviewed, Old chart reviewed, Radiograph reviewed  and Notes from prior ED visits   42 year old female who presents with right-sided abdominal pain and nausea. Differential diagnosis includes, but is not limited to, biliary disease (biliary colic, acute cholecystitis, cholangitis, choledocholithiasis, etc), intrathoracic causes for epigastric abdominal pain including ACS, gastritis, duodenitis, pancreatitis,  small bowel or large bowel obstruction, abdominal aortic aneurysm, hernia, and ulcer(s).  Patient had lab work, urine and CT scan prior to being roomed.  Those are notable for mild leukocytosis and elevated lipase.  Do not feel patient needs ultrasound as CT did not demonstrate gallstones.  I personally reviewed patient's scalp film which shows moderate stool burden.  Will discharge home on Norco, Zofran and Lactulose; all to be used as needed.  Strict return precautions given.  Patient verbalizes understanding and agrees with plan of care.      ____________________________________________   FINAL CLINICAL IMPRESSION(S) / ED DIAGNOSES  Final diagnoses:  Abdominal pain, unspecified abdominal location  Acute pancreatitis without infection or necrosis, unspecified pancreatitis type     ED Discharge Orders    None       Note:  This document was prepared using Dragon voice recognition software and may include unintentional dictation errors.    Irean Hong, MD 10/13/17 [phone]

## 2017-10-13 NOTE — Discharge Instructions (Signed)
1.  You may take pain and nausea medicines as needed (Norco/Zofran).  Start a daily stool softener if you are having to take the strong pain medicine. 2.  You may take Lactulose as needed for bowel movements. 3.  Drink plenty of fluids daily. 4.  Return to the ER for worsening symptoms, persistent vomiting, difficulty breathing, fever or other concerns.

## 2017-10-13 NOTE — ED Notes (Signed)
Pt noted to be in waiting room now; CT order had been placed for pt and she was in radiology; ambulatory with steady gait to treatment room 4

## 2018-10-28 ENCOUNTER — Emergency Department
Admission: EM | Admit: 2018-10-28 | Discharge: 2018-10-28 | Disposition: A | Discharge status: Home / Self Care | Payer: No Typology Code available for payment source | Attending: Emergency Medicine | Admitting: Emergency Medicine

## 2018-10-28 ENCOUNTER — Other Ambulatory Visit: Payer: Self-pay

## 2018-10-28 ENCOUNTER — Emergency Department: Payer: No Typology Code available for payment source

## 2018-10-28 DIAGNOSIS — Y999 Unspecified external cause status: Secondary | ICD-10-CM | POA: Diagnosis not present

## 2018-10-28 DIAGNOSIS — Y92410 Unspecified street and highway as the place of occurrence of the external cause: Secondary | ICD-10-CM | POA: Insufficient documentation

## 2018-10-28 DIAGNOSIS — M545 Low back pain, unspecified: Secondary | ICD-10-CM

## 2018-10-28 DIAGNOSIS — T148XXA Other injury of unspecified body region, initial encounter: Secondary | ICD-10-CM | POA: Insufficient documentation

## 2018-10-28 DIAGNOSIS — Z79899 Other long term (current) drug therapy: Secondary | ICD-10-CM | POA: Diagnosis not present

## 2018-10-28 DIAGNOSIS — Y939 Activity, unspecified: Secondary | ICD-10-CM | POA: Insufficient documentation

## 2018-10-28 MED ORDER — NAPROXEN 500 MG PO TABS: 500.0000 mg | ORAL_TABLET | Freq: Two times a day (BID) | ORAL | 0 refills | Status: DC

## 2018-10-28 MED ORDER — CYCLOBENZAPRINE HCL 10 MG PO TABS: 10.0000 mg | ORAL_TABLET | Freq: Three times a day (TID) | ORAL | 0 refills | Status: DC | PRN

## 2018-10-28 MED ORDER — NAPROXEN 500 MG PO TABS
500.0000 mg | ORAL_TABLET | Freq: Once | ORAL | Status: AC
  Administered 2018-10-28: 500 mg via ORAL
  Filled 2018-10-28: qty 1
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
## 2018-12-31 ENCOUNTER — Emergency Department: Payer: Medicaid Other

## 2018-12-31 ENCOUNTER — Encounter: Payer: Self-pay | Admitting: Emergency Medicine

## 2018-12-31 ENCOUNTER — Other Ambulatory Visit: Payer: Self-pay

## 2018-12-31 ENCOUNTER — Emergency Department
Admission: EM | Admit: 2018-12-31 | Discharge: 2018-12-31 | Disposition: A | Discharge status: Home / Self Care | Payer: Medicaid Other | Attending: Emergency Medicine | Admitting: Emergency Medicine

## 2018-12-31 DIAGNOSIS — M25562 Pain in left knee: Secondary | ICD-10-CM | POA: Diagnosis not present

## 2018-12-31 DIAGNOSIS — M5432 Sciatica, left side: Secondary | ICD-10-CM

## 2018-12-31 DIAGNOSIS — M79605 Pain in left leg: Secondary | ICD-10-CM | POA: Diagnosis present

## 2018-12-31 DIAGNOSIS — M1711 Unilateral primary osteoarthritis, right knee: Secondary | ICD-10-CM | POA: Diagnosis not present

## 2018-12-31 DIAGNOSIS — M25561 Pain in right knee: Secondary | ICD-10-CM

## 2018-12-31 DIAGNOSIS — Z79899 Other long term (current) drug therapy: Secondary | ICD-10-CM | POA: Diagnosis not present

## 2018-12-31 LAB — URINALYSIS, COMPLETE (UACMP) WITH MICROSCOPIC
Bacteria, UA: NONE SEEN
Bilirubin Urine: NEGATIVE
Glucose, UA: NEGATIVE mg/dL
Hgb urine dipstick: NEGATIVE
Ketones, ur: NEGATIVE mg/dL
Leukocytes,Ua: NEGATIVE
Nitrite: NEGATIVE
Protein, ur: NEGATIVE mg/dL
Specific Gravity, Urine: 1.009 (ref 1.005–1.030)
pH: 7 (ref 5.0–8.0)

## 2018-12-31 LAB — POCT PREGNANCY, URINE: Preg Test, Ur: NEGATIVE

## 2018-12-31 MED ORDER — CYCLOBENZAPRINE HCL 5 MG PO TABS: ORAL_TABLET | ORAL | 0 refills | Status: DC

## 2018-12-31 MED ORDER — PREDNISONE 50 MG PO TABS: ORAL_TABLET | ORAL | 0 refills | Status: DC

## 2018-12-31 MED ORDER — OXYCODONE-ACETAMINOPHEN 5-325 MG PO TABS: 1.0000 | ORAL_TABLET | Freq: Three times a day (TID) | ORAL | 0 refills | Status: AC | PRN

## 2018-12-31 MED ORDER — ORPHENADRINE CITRATE 30 MG/ML IJ SOLN
60.0000 mg | Freq: Two times a day (BID) | INTRAMUSCULAR | Status: DC
  Administered 2018-12-31: 60 mg via INTRAMUSCULAR
  Filled 2018-12-31: qty 2

## 2018-12-31 MED ORDER — KETOROLAC TROMETHAMINE 30 MG/ML IJ SOLN
30.0000 mg | Freq: Once | INTRAMUSCULAR | Status: AC
  Administered 2018-12-31: 30 mg via INTRAMUSCULAR
  Filled 2018-12-31: qty 1

## 2018-12-31 MED ORDER — IBUPROFEN 600 MG PO TABS: 600.0000 mg | ORAL_TABLET | Freq: Four times a day (QID) | ORAL | 0 refills | Status: DC | PRN
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
## 2018-12-31 NOTE — ED Notes (Signed)
Pt assisted back to bed, tolerated well.
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
## 2018-12-31 NOTE — Discharge Instructions (Addendum)
Your x-ray shows you have some arthritis in your right knee.  Your exam is consistent with sciatica to your left leg.  There is no clot on your ultrasound.  Your back x-ray and your left knee x-ray are normal.  Please apply heat to your back and hip.  Follow-up with orthopedics.  You can take Flexeril to help relax your muscles.  Start ibuprofen and prednisone for pain and inflammation.  Take Percocet for extreme pain.
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
## 2018-12-31 NOTE — ED Notes (Signed)
This RN woke pt up from resting in bed with eyes closed, rise and fall of chest noted, pt states "I'm just really tired." Pt encouraged to give urine sample, wheelchair used to take pt to the bathroom, specimine obtained.

## 2019-01-07 ENCOUNTER — Emergency Department: Payer: Medicaid Other

## 2019-01-07 ENCOUNTER — Encounter: Payer: Self-pay | Admitting: Emergency Medicine

## 2019-01-07 ENCOUNTER — Emergency Department
Admission: EM | Admit: 2019-01-07 | Discharge: 2019-01-07 | Disposition: A | Discharge status: Home / Self Care | Payer: Medicaid Other | Attending: Emergency Medicine | Admitting: Emergency Medicine

## 2019-01-07 ENCOUNTER — Other Ambulatory Visit: Payer: Self-pay

## 2019-01-07 DIAGNOSIS — Y999 Unspecified external cause status: Secondary | ICD-10-CM | POA: Insufficient documentation

## 2019-01-07 DIAGNOSIS — Y92410 Unspecified street and highway as the place of occurrence of the external cause: Secondary | ICD-10-CM | POA: Insufficient documentation

## 2019-01-07 DIAGNOSIS — M546 Pain in thoracic spine: Secondary | ICD-10-CM | POA: Diagnosis not present

## 2019-01-07 DIAGNOSIS — Z79899 Other long term (current) drug therapy: Secondary | ICD-10-CM | POA: Insufficient documentation

## 2019-01-07 DIAGNOSIS — S199XXA Unspecified injury of neck, initial encounter: Secondary | ICD-10-CM | POA: Diagnosis present

## 2019-01-07 DIAGNOSIS — S161XXA Strain of muscle, fascia and tendon at neck level, initial encounter: Secondary | ICD-10-CM | POA: Diagnosis not present

## 2019-01-07 DIAGNOSIS — M545 Low back pain: Secondary | ICD-10-CM | POA: Diagnosis not present

## 2019-01-07 DIAGNOSIS — Y9389 Activity, other specified: Secondary | ICD-10-CM | POA: Insufficient documentation

## 2019-01-07 DIAGNOSIS — R0789 Other chest pain: Secondary | ICD-10-CM | POA: Diagnosis not present

## 2019-01-07 DIAGNOSIS — M7918 Myalgia, other site: Secondary | ICD-10-CM

## 2019-01-07 MED ORDER — KETOROLAC TROMETHAMINE 30 MG/ML IJ SOLN
30.0000 mg | Freq: Once | INTRAMUSCULAR | Status: AC
  Administered 2019-01-07: 15:00:00 30 mg via INTRAMUSCULAR

## 2019-01-07 MED ORDER — KETOROLAC TROMETHAMINE 30 MG/ML IJ SOLN
INTRAMUSCULAR | Status: AC
  Administered 2019-01-07: 30 mg via INTRAMUSCULAR
  Filled 2019-01-07: qty 1

## 2019-01-07 MED ORDER — OXYCODONE-ACETAMINOPHEN 7.5-325 MG PO TABS: 1.0000 | ORAL_TABLET | Freq: Four times a day (QID) | ORAL | 0 refills | Status: AC | PRN

## 2019-01-07 MED ORDER — OXYCODONE-ACETAMINOPHEN 7.5-325 MG PO TABS
1.0000 | ORAL_TABLET | Freq: Once | ORAL | Status: DC
  Filled 2019-01-07: qty 1

## 2019-01-07 MED ORDER — NAPROXEN 500 MG PO TABS: 500.0000 mg | ORAL_TABLET | Freq: Two times a day (BID) | ORAL | 0 refills | Status: AC

## 2019-01-07 MED ORDER — METHOCARBAMOL 500 MG PO TABS: ORAL_TABLET | ORAL | 0 refills | Status: AC
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
## 2019-01-07 NOTE — ED Notes (Signed)
Pt visualized texting on her cell phone and taking snap chats on her cell phone while in the lobby. Pt continuously moaning, noted to stop while taking snap chats.
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
## 2019-01-07 NOTE — Discharge Instructions (Signed)
Call and make an appointment with one of the clinics listed on your discharge papers and establish a primary care provider.  You may also follow-up with Dr. Roland Rack at the orthopedic department of [facility].  You may need to see a specialist if you continue having problems with your neck or back.  Begin taking medication only as directed.  The methocarbamol is 1 or 2 tablets every 6 hours as needed for muscle spasms, naproxen for inflammation and pain and Percocet every 6 hours as needed for severe pain.  Do not drive or operate machinery while taking the methocarbamol and Percocet.  You may use ice or heat to your muscles as needed for discomfort.  Return to the emergency department if any severe worsening of your symptoms.

## 2019-01-07 NOTE — ED Notes (Signed)
Pt transported to Xray via bed
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
